# Patient Record
Sex: Male | Born: 1995 | Race: White | Hispanic: No | Marital: Single | Smoking: Never smoker
Health system: Southern US, Community
[De-identification: ages and names within clinical notes are randomized; demographics above are authoritative.]

## PROBLEM LIST (undated history)

## (undated) HISTORY — PX: CIRCUMCISION: SUR203

---
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
## 2019-06-21 ENCOUNTER — Ambulatory Visit
Admission: EM | Admit: 2019-06-21 | Discharge: 2019-06-21 | Disposition: A | Discharge status: Home / Self Care | Payer: BC Managed Care – PPO | Attending: Emergency Medicine | Admitting: Emergency Medicine

## 2019-06-21 ENCOUNTER — Other Ambulatory Visit: Payer: Self-pay

## 2019-06-21 DIAGNOSIS — A084 Viral intestinal infection, unspecified: Secondary | ICD-10-CM

## 2019-06-21 DIAGNOSIS — Z20822 Contact with and (suspected) exposure to covid-19: Secondary | ICD-10-CM | POA: Diagnosis not present

## 2019-06-21 MED ORDER — ONDANSETRON HCL 4 MG PO TABS: 4.0000 mg | ORAL_TABLET | Freq: Four times a day (QID) | ORAL | 0 refills | Status: AC

## 2019-06-21 MED ORDER — ONDANSETRON 4 MG PO TBDP
4.0000 mg | ORAL_TABLET | Freq: Once | ORAL | Status: AC
  Administered 2019-06-21: 4 mg via ORAL

## 2019-06-21 NOTE — ED Triage Notes (Signed)
Pt presents to UC w/ c/o vomiting x2 days. Pt states it occurs after eating solid food. He is able to drink liquids. Pt has also been having diarrhea.

## 2019-06-21 NOTE — Discharge Instructions (Addendum)
Covid test ordered  In the meantime:  You should remain isolated in your home for 10 days from symptom onset AND greater than 72 hours after symptoms resolution (absence of fever without the use of fever-reducing medication and improvement in respiratory symptoms), whichever is longer Get plenty of rest and push fluids May supplement with OTC Pedialyte and/or oral rehydration solution Zofran prescribed.  Take as directed Progress diet as tolerated.  Avoid fatty greasy foods, or anything that does not sit well with you If you experience new or worsening symptoms return or go to ER such as fever, chills, nausea, vomiting, diarrhea, bloody or dark tarry stools, constipation, urinary symptoms, worsening abdominal discomfort, symptoms that do not improve with medications, inability to keep fluids down, etc..Marland Kitchen

## 2019-06-21 NOTE — ED Provider Notes (Signed)
[facility]   [id number] 06/21/19 Arrival Time: 1546  CC: n/v/d  SUBJECTIVE:  STEPFON Huber is a 24 y.o. male who presents with complaint of nausea, 4 episodes of vomiting, and 2 episodes of diarrhea that began 2 days.  Symptoms began after eating a sandwich at work.  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  Denies sick exposure to COVID, flu or strep.  Denies recent travel.   Has tried OTC tums with minimal relief. Symptoms made worse with eating solid foods, but tolerating liquids without difficulty.    Denies fever, chills, rhinorrhea, congestion, sore throat, cough, chest pain, SOB, constipation, hematochezia, melena, dysuria, difficulty urinating, increased frequency or urgency, flank pain, loss of bowel or bladder function.    Declines COVID test.    ROS: As per HPI.  All other pertinent ROS negative.     History reviewed. No pertinent past medical history. Past Surgical History:  Procedure Laterality Date  . CIRCUMCISION     No Known Allergies No current facility-administered medications on file prior to encounter.   No current outpatient medications on file prior to encounter.   Social History   Socioeconomic History  . Marital status: Single    Spouse name: Not on file  . Number of children: Not on file  . Years of education: Not on file  . Highest education level: Not on file  Occupational History  . Not on file  Tobacco Use  . Smoking status: Never Smoker  . Smokeless tobacco: Never Used  Substance and Sexual Activity  . Alcohol use: Yes    Comment: weekends  . Drug use: No  . Sexual activity: Not on file  Other Topics Concern  . Not on file  Social History Narrative   Lives with parents and 2 brothers.                   Social Determinants of Health   Financial Resource Strain:   . Difficulty of Paying Living Expenses: Not on file  Food Insecurity:   . Worried About Programme researcher, broadcasting/film/video in the Last Year: Not on  file  . Ran Out of Food in the Last Year: Not on file  Transportation Needs:   . Lack of Transportation (Medical): Not on file  . Lack of Transportation (Non-Medical): Not on file  Physical Activity:   . Days of Exercise per Week: Not on file  . Minutes of Exercise per Session: Not on file  Stress:   . Feeling of Stress : Not on file  Social Connections:   . Frequency of Communication with Friends and Family: Not on file  . Frequency of Social Gatherings with Friends and Family: Not on file  . Attends Religious Services: Not on file  . Active Member of Clubs or Organizations: Not on file  . Attends Banker Meetings: Not on file  . Marital Status: Not on file  Intimate Partner Violence:   . Fear of Current or Ex-Partner: Not on file  . Emotionally Abused: Not on file  . Physically Abused: Not on file  . Sexually Abused: Not on file   Family History  Problem Relation Age of Onset  . Diabetes Maternal Grandmother   . Obesity Maternal Grandmother   . Thyroid disease Maternal Grandmother   . Healthy Mother      OBJECTIVE:  Vitals:   06/21/19 1556  BP: 126/87  Pulse: 86  Resp: 16  Temp: 98.1 F (  36.7 C)  TempSrc: Oral  SpO2: 96%    General appearance: Alert; NAD HEENT: NCAT.  Oropharynx clear.  Lungs: clear to auscultation bilaterally without adventitious breath sounds Heart: regular rate and rhythm.   Abdomen: soft, non-distended; normal active bowel sounds; non-tender to light and deep palpation; no guarding Extremities: no edema; symmetrical with no gross deformities Skin: warm and dry Neurologic: normal gait Psychological: alert and cooperative; normal mood and affect  ASSESSMENT & PLAN:  1. Suspected COVID-19 virus infection   2. Viral gastroenteritis     Meds ordered this encounter  Medications  . ondansetron (ZOFRAN-ODT) disintegrating tablet 4 mg  . ondansetron (ZOFRAN) 4 MG tablet    Sig: Take 1 tablet (4 mg total) by mouth every 6 (six)  hours.    Dispense:  12 tablet    Refill:  0    Order Specific Question:   Supervising Provider    Answer:   Raylene Everts [[id number]]    COVID test ordered.    In the meantime:  You should remain isolated in your home for 10 days from symptom onset AND greater than 72 hours after symptoms resolution (absence of fever without the use of fever-reducing medication and improvement in respiratory symptoms), whichever is longer Get plenty of rest and push fluids May supplement with OTC Pedialyte and/or oral rehydration solution Zofran prescribed.  Take as directed Progress diet as tolerated.  Avoid fatty greasy foods, or anything that does not sit well with you If you experience new or worsening symptoms return or go to ER such as fever, chills, nausea, vomiting, diarrhea, bloody or dark tarry stools, constipation, urinary symptoms, worsening abdominal discomfort, symptoms that do not improve with medications, inability to keep fluids down, etc...  Reviewed expectations re: course of current medical issues. Questions answered. Outlined signs and symptoms indicating need for more acute intervention. Patient verbalized understanding. After Visit Summary given.   Lestine Box, PA-C 06/21/19 1619

## 2019-06-22 LAB — NOVEL CORONAVIRUS, NAA: SARS-CoV-2, NAA: NOT DETECTED

## 2021-02-12 ENCOUNTER — Ambulatory Visit: Payer: BC Managed Care – PPO

## 2021-02-12 ENCOUNTER — Other Ambulatory Visit: Payer: Self-pay

## 2021-02-12 ENCOUNTER — Ambulatory Visit
Admission: EM | Admit: 2021-02-12 | Discharge: 2021-02-12 | Disposition: A | Discharge status: Home / Self Care | Payer: BC Managed Care – PPO

## 2021-02-12 ENCOUNTER — Ambulatory Visit (INDEPENDENT_AMBULATORY_CARE_PROVIDER_SITE_OTHER): Payer: BC Managed Care – PPO

## 2021-02-12 DIAGNOSIS — M25571 Pain in right ankle and joints of right foot: Secondary | ICD-10-CM

## 2021-02-12 DIAGNOSIS — M79672 Pain in left foot: Secondary | ICD-10-CM | POA: Diagnosis not present

## 2021-02-12 DIAGNOSIS — M25572 Pain in left ankle and joints of left foot: Secondary | ICD-10-CM | POA: Diagnosis not present

## 2021-02-12 DIAGNOSIS — M79671 Pain in right foot: Secondary | ICD-10-CM

## 2021-02-12 MED ORDER — NAPROXEN 500 MG PO TABS: 500.0000 mg | ORAL_TABLET | Freq: Two times a day (BID) | ORAL | 0 refills | Status: DC

## 2021-02-12 NOTE — ED Triage Notes (Signed)
1.5 wk h/o bilateral posterior ankle pain. Pt reports that he has been running more in the last few weeks. Left ankle pain greater than right. Pain is worse in the morning and with applying pressure. Confirms swelling. Has been taking ibuprofen with relief. Massaging his posterior calf helps decrease sxs some. Confirms a h/o tendonitis.

## 2021-02-12 NOTE — ED Provider Notes (Signed)
[facility]   MRN: [id number] DOB: 05-22-1995  Subjective:   Joseph Huber is a 25 y.o. male presenting for 2 week history of recurrent bilateral foot and ankle pain.  Patient is an avid runner, started back running and initiated at 6 miles a day.  Before this he was not active with his running.  This ended up causing a lot of right foot pain, ankle pain and swelling.  It has improved but he kept running and now his left foot and ankle hurt with a swelling and protrusion over the left lateral upper portion of his heel.  Denies any particular fall, trauma.  He has a history of Achilles tendinitis.  No current facility-administered medications for this encounter.  Current Outpatient Medications:    ondansetron (ZOFRAN) 4 MG tablet, Take 1 tablet (4 mg total) by mouth every 6 (six) hours., Disp: 12 tablet, Rfl: 0   sertraline (ZOLOFT) 50 MG tablet, Take 50 mg by mouth daily., Disp: , Rfl:    traZODone (DESYREL) 50 MG tablet, Take 50 mg by mouth at bedtime., Disp: , Rfl:    No Known Allergies  History reviewed. No pertinent past medical history.   Past Surgical History:  Procedure Laterality Date   CIRCUMCISION      Family History  Problem Relation Age of Onset   Diabetes Maternal Grandmother    Obesity Maternal Grandmother    Thyroid disease Maternal Grandmother    Healthy Mother     Social History   Tobacco Use   Smoking status: Never   Smokeless tobacco: Never  Vaping Use   Vaping Use: Some days  Substance Use Topics   Alcohol use: Yes    Comment: weekends   Drug use: No    ROS   Objective:   Vitals: BP 127/82 (BP Location: Right Arm)   Pulse (!) 59   Temp 98 F (36.7 C) (Oral)   Resp 18   SpO2 97%   Physical Exam Constitutional:      General: He is not in acute distress.    Appearance: Normal appearance. He is well-developed and normal weight. He is not ill-appearing, toxic-appearing or diaphoretic.  HENT:     Head: Normocephalic  and atraumatic.     Right Ear: External ear normal.     Left Ear: External ear normal.     Nose: Nose normal.     Mouth/Throat:     Pharynx: Oropharynx is clear.  Eyes:     General: No scleral icterus.       Right eye: No discharge.        Left eye: No discharge.     Extraocular Movements: Extraocular movements intact.     Pupils: Pupils are equal, round, and reactive to light.  Cardiovascular:     Rate and Rhythm: Normal rate.  Pulmonary:     Effort: Pulmonary effort is normal.  Musculoskeletal:     Cervical back: Normal range of motion.     Right ankle: No swelling, deformity, ecchymosis or lacerations. No tenderness. Normal range of motion.     Right Achilles Tendon: No tenderness or defects. Thompson's test negative.     Left ankle: No swelling, deformity, ecchymosis or lacerations. No tenderness. Normal range of motion.     Left Achilles Tendon: No tenderness or defects. Thompson's test negative.     Right foot: Normal range of motion and normal capillary refill. No swelling, deformity, laceration, tenderness, bony tenderness or crepitus.     Left  foot: Normal range of motion and normal capillary refill. Swelling, tenderness and bony tenderness present. No deformity, laceration or crepitus.       Feet:  Neurological:     Mental Status: He is alert and oriented to person, place, and time.  Psychiatric:        Mood and Affect: Mood normal.        Behavior: Behavior normal.        Thought Content: Thought content normal.        Judgment: Judgment normal.    DG Foot Complete Left  Result Date: 02/12/2021 CLINICAL DATA:  25 year old male with 1.5 weeks of ankle and foot pain. Has increased running activity. EXAM: LEFT FOOT - COMPLETE 3+ VIEW COMPARISON:  None. FINDINGS: Bone mineralization is within normal limits. There is no evidence of fracture or dislocation. There is no evidence of arthropathy or other focal bone abnormality. Soft tissues are unremarkable. IMPRESSION:  Negative. Electronically Signed   By: Odessa Fleming M.D.   On: 02/12/2021 09:57     Assessment and Plan :   PDMP not reviewed this encounter.  1. Heel pain, bilateral   2. Acute bilateral ankle pain    X-ray was read as negative in clinic.  Recommended supportive care, naproxen, RICE method. X-ray over-read was pending at time of discharge, recommended follow up with only abnormal results. Otherwise will not call for negative over-read. Patient was in agreement.  Counseled patient on potential for adverse effects with medications prescribed/recommended today, ER and return-to-clinic precautions discussed, patient verbalized understanding.    Wallis Bamberg, PA-C 02/12/21 1119

## 2021-05-02 ENCOUNTER — Other Ambulatory Visit: Payer: Self-pay

## 2021-05-02 ENCOUNTER — Ambulatory Visit
Admission: EM | Admit: 2021-05-02 | Discharge: 2021-05-02 | Disposition: A | Discharge status: Home / Self Care | Payer: BC Managed Care – PPO | Attending: Urgent Care | Admitting: Urgent Care

## 2021-05-02 DIAGNOSIS — S39012A Strain of muscle, fascia and tendon of lower back, initial encounter: Secondary | ICD-10-CM | POA: Diagnosis not present

## 2021-05-02 DIAGNOSIS — M545 Low back pain, unspecified: Secondary | ICD-10-CM | POA: Diagnosis not present

## 2021-05-02 DIAGNOSIS — M62830 Muscle spasm of back: Secondary | ICD-10-CM | POA: Diagnosis not present

## 2021-05-02 MED ORDER — KETOROLAC TROMETHAMINE 30 MG/ML IJ SOLN
30.0000 mg | Freq: Once | INTRAMUSCULAR | Status: AC
  Administered 2021-05-02: 30 mg via INTRAMUSCULAR

## 2021-05-02 MED ORDER — TIZANIDINE HCL 4 MG PO TABS: 4.0000 mg | ORAL_TABLET | Freq: Every day | ORAL | 0 refills | Status: AC

## 2021-05-02 MED ORDER — NAPROXEN 375 MG PO TABS: 375.0000 mg | ORAL_TABLET | Freq: Two times a day (BID) | ORAL | 0 refills | Status: AC
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
## 2022-08-30 IMAGING — DX DG FOOT COMPLETE 3+V*L*
3 series · 3 of 3 positions shown · non-contrast
Comparison: None.

CLINICAL DATA: 25-year-old male with 1.5 weeks of ankle and foot
pain. Has increased running activity.

EXAM:
LEFT FOOT - COMPLETE 3+ VIEW

[foot ap]
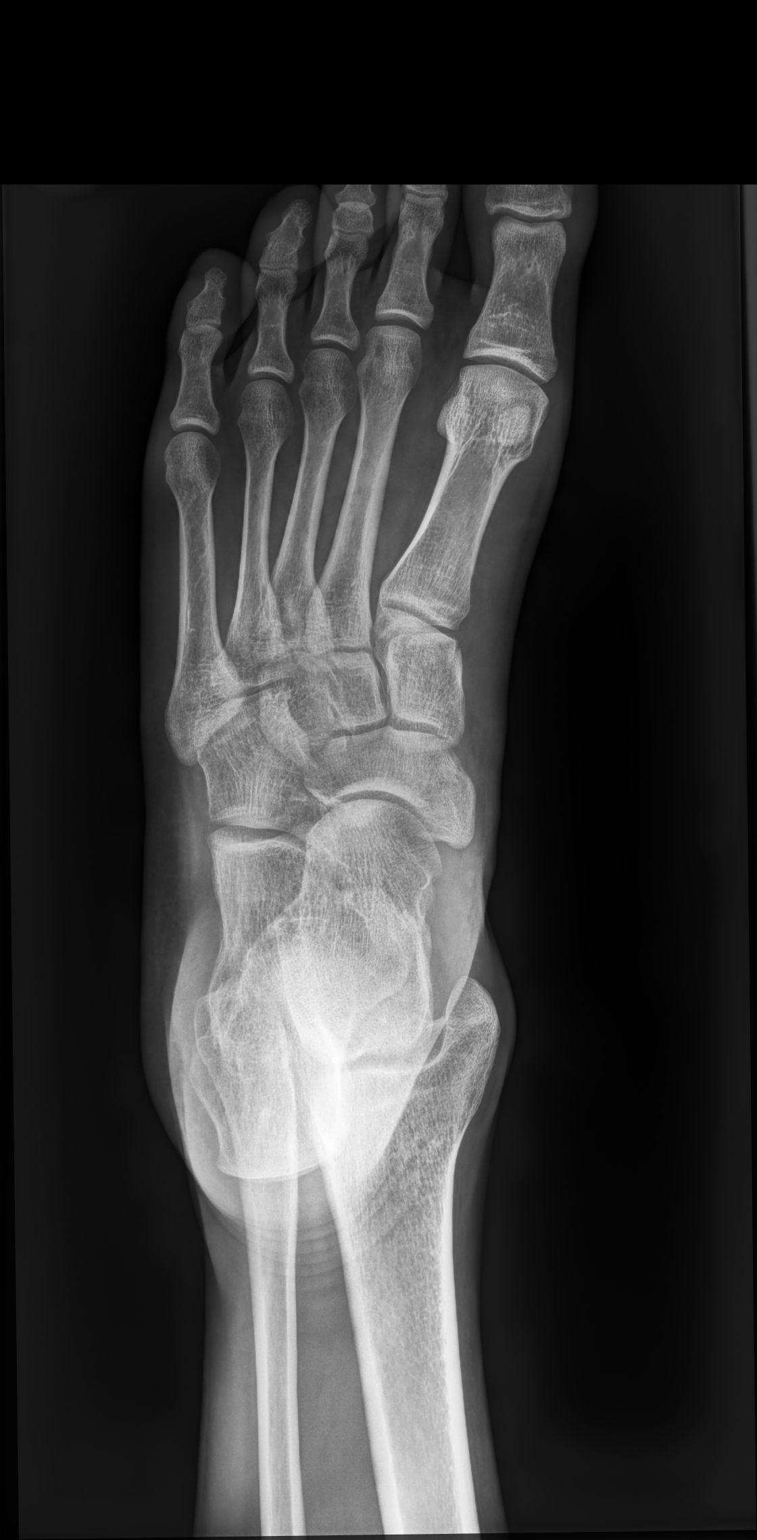

[foot mlo]
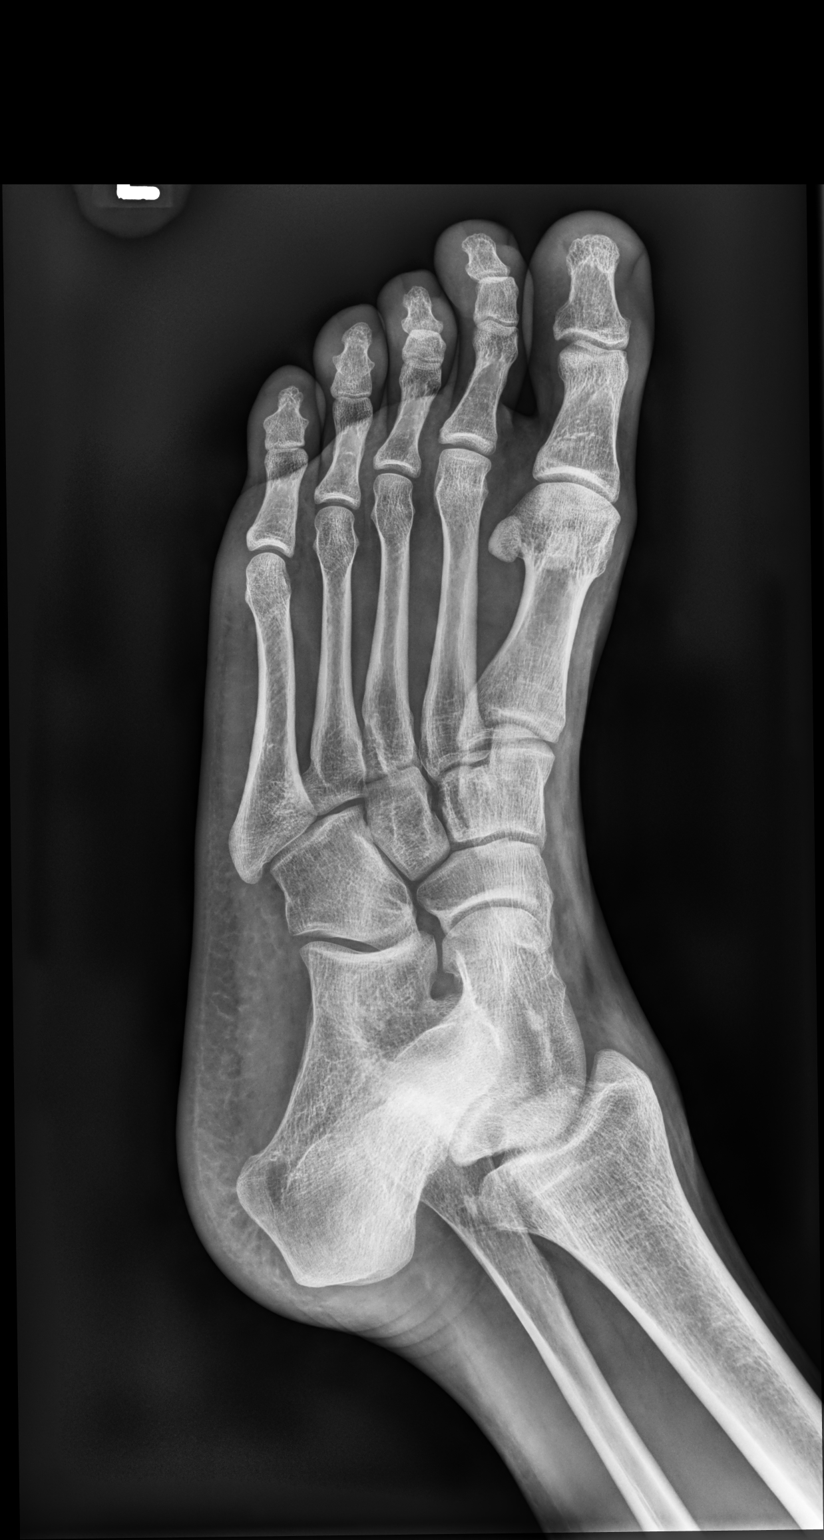

[foot lat]
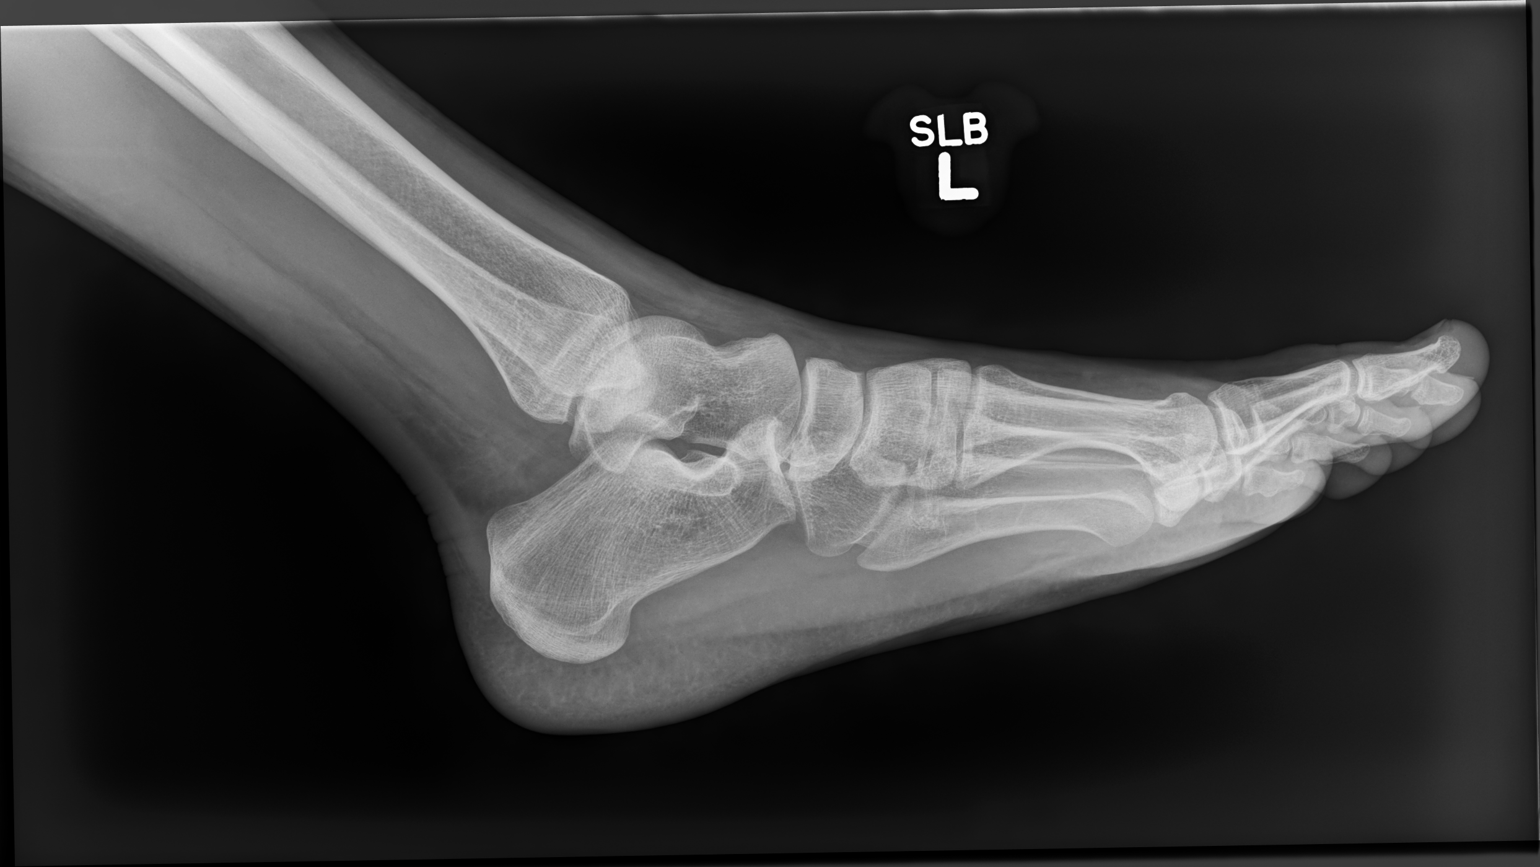

[3 of 3 positions shown; findings below may reference images not displayed]

FINDINGS: Bone mineralization is within normal limits. There is no evidence of
fracture or dislocation. There is no evidence of arthropathy or
other focal bone abnormality. Soft tissues are unremarkable.
IMPRESSION: Negative.
